# Patient Record
Sex: Female | Born: 1990 | Race: White | Smoking: Never smoker
Health system: Southern US, Community
[De-identification: ages and names within clinical notes are randomized; demographics above are authoritative.]

## PROBLEM LIST (undated history)

## (undated) DIAGNOSIS — Z789 Other specified health status: Secondary | ICD-10-CM

## (undated) HISTORY — PX: APPENDECTOMY: SHX54

---

## 2016-03-03 ENCOUNTER — Encounter (HOSPITAL_COMMUNITY): Payer: Self-pay | Admitting: Obstetrics and Gynecology

## 2016-03-03 ENCOUNTER — Other Ambulatory Visit (HOSPITAL_COMMUNITY): Payer: Self-pay | Admitting: Obstetrics and Gynecology

## 2016-03-03 DIAGNOSIS — Z3A21 21 weeks gestation of pregnancy: Secondary | ICD-10-CM

## 2016-03-03 DIAGNOSIS — O283 Abnormal ultrasonic finding on antenatal screening of mother: Secondary | ICD-10-CM

## 2016-03-08 ENCOUNTER — Encounter (HOSPITAL_COMMUNITY): Payer: Self-pay | Admitting: *Deleted

## 2016-03-09 ENCOUNTER — Ambulatory Visit (HOSPITAL_COMMUNITY)
Admission: RE | Admit: 2016-03-09 | Discharge: 2016-03-09 | Disposition: A | Discharge status: Home / Self Care | Payer: Medicaid Other | Source: Ambulatory Visit | Attending: Obstetrics and Gynecology | Admitting: Obstetrics and Gynecology

## 2016-03-09 ENCOUNTER — Other Ambulatory Visit (HOSPITAL_COMMUNITY): Payer: Self-pay | Admitting: Obstetrics and Gynecology

## 2016-03-09 ENCOUNTER — Encounter (HOSPITAL_COMMUNITY): Payer: Self-pay

## 2016-03-09 ENCOUNTER — Other Ambulatory Visit (HOSPITAL_COMMUNITY): Payer: Self-pay | Admitting: *Deleted

## 2016-03-09 DIAGNOSIS — Z3A21 21 weeks gestation of pregnancy: Secondary | ICD-10-CM | POA: Insufficient documentation

## 2016-03-09 DIAGNOSIS — O358XX0 Maternal care for other (suspected) fetal abnormality and damage, not applicable or unspecified: Secondary | ICD-10-CM | POA: Diagnosis not present

## 2016-03-09 DIAGNOSIS — Z315 Encounter for genetic counseling: Secondary | ICD-10-CM | POA: Insufficient documentation

## 2016-03-09 DIAGNOSIS — O283 Abnormal ultrasonic finding on antenatal screening of mother: Secondary | ICD-10-CM

## 2016-03-09 DIAGNOSIS — Z363 Encounter for antenatal screening for malformations: Secondary | ICD-10-CM

## 2016-03-09 DIAGNOSIS — O359XX0 Maternal care for (suspected) fetal abnormality and damage, unspecified, not applicable or unspecified: Secondary | ICD-10-CM

## 2016-03-09 DIAGNOSIS — O35BXX0 Maternal care for other (suspected) fetal abnormality and damage, fetal cardiac anomalies, not applicable or unspecified: Secondary | ICD-10-CM

## 2016-03-09 HISTORY — DX: Other specified health status: Z78.9

## 2016-03-09 NOTE — Addendum Note (Signed)
Encounter addended by: Despina Ariashristy Woodrow Drab on: 03/09/2016 12:28 PM<BR>    Actions taken: Chief Complaint modified, Visit diagnoses modified, Problem List modified, Charge Capture section accepted, Letter status changed, Sign clinical note

## 2016-03-09 NOTE — Progress Notes (Signed)
Maternal Fetal Medicine Consultation  Requesting Provider(s): Mannino  Primary Ob: Mannino Reason for consultation: suspected fetal cardiac defect  HPI: the patient is a 25 year old P1001 at 23+0 weeks referred after US in the office showed a suspected cardiac anomaly of the fetus. The patient was told that the heart vessels looked abnormal. Her pregnancy has been unremarkable so far. She had negative first trimester screening and a normal MSAFP. Her previous pregnancy was an uncomplicated NSVD at term. She has no medication or environmental exposures that she is aware of.  OB History: OB History    Gravida Para Term Preterm AB Living   2 1 1     1    SAB TAB Ectopic Multiple Live Births                  PMH:  Past Medical History:  Diagnosis Date  . Medical history non-contributory     PSH:  Past Surgical History:  Procedure Laterality Date  . APPENDECTOMY     Meds: PNV Allergies: NKDA Fh: No significant family history. No relatives with cardiac anomalies Soc: See EPIC section  Review of Systems: no vaginal bleeding or cramping/contractions, no LOF, no nausea/vomiting. All other systems reviewed and are negative.  PE: See EPIC section  Please see separate document for fetal ultrasound report.  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 This is associated with few if any genetic syndromes, and the patient and husband have declined further genetic testing after discussion with the genetic councilor. Further, D-TGV rarely causes antepartum problems. We will send this patient ASAP to pediatric cardiology for confirmation of the diagnosis and discussion of delivery planning. I have asked her to return for a repeat evaluation in 4 weeks  Thank you for the opportunity to be a part of the care of Valerie Warren. Please contact our office if we can be of further assistance.   I spent approximately 30 minutes  with this patient with over 50% of time spent in face-to-face counseling.

## 2016-03-09 NOTE — Progress Notes (Addendum)
Genetic Counseling  Visit Summary Note  Appointment Date: 03/09/2016 Referred By: Damaris Schooner  Date of Birth: 1990-09-25  Pregnancy history: G2P1001 Estimated Date of Delivery: 07/18/16 Estimated Gestational Age: [redacted]w[redacted]d I met with Valerie Warren ACarma Leavenfor genetic counseling because of abnormal ultrasound findings.  In summary:  Discussed ultrasound findings in detail-probable TGA  Reviewed the various etiologies of CHD  Reviewed options for additional screening  NIPS-declined Panorama, MaterniTGenome, and Vistara  Echocardiogram-scheduled  Ongoing ultrasound-scheduled  Reviewed options for diagnostic testing, including risks, benefits, limitations and alternatives  Amniocentesis for karyotype and microarray analyses-declined  Reviewed other explanations for ultrasound findings  Discussed option of continuation vs. Termination of pregnancy-wish to continue  Reviewed family history concerns-noncontributory  I met with Valerie Warren Warren her husband, Mr. Valerie Warren for genetic counseling because of abnormal ultrasound findings.    [name] an ultrasound at the referring provider's office on 03/02/16, which revealed a probable fetal heart defect. [facility] [place] [facility] [place].  A complete detailed ultrasound was performed today.  The report will be documented separately.  Ultrasound today revealed transposition of the great arteries (TGA). The remainder of the visualized fetal anatomy appeared normal.  This couple was counseled that congenital heart defects occur at a frequency of approximately 1 in 100 births. We discussed that TGA is a rare congenital heart defect in which the two main arteries leaving the heart are reversed, transposed. TGA changes the way that blood circulates through the body, leaving a shortage of oxygen in the blood flowing from the heart to  the rest of the body. We discussed that congenital heart defects can be genetic, environmental, or multifactorial in etiology. They were counseled that congenital heart defects are prominent features in chromosome conditions, particularly autosomal trisomies, Turner syndrome, and microdeletions.  Approximately 15-20% of congenital heart defects result from an underlying chromosome condition.  In addition, single gene conditions are also a common genetic cause of congenital heart defects. To date, more than 500 single gene disorders are known to have congenital heart disease as a described feature.  We reviewed chromosomes, nondisjunction, and the common features of Down syndrome, trisomies 13/18, and 22q11 deletion syndrome.  We also briefly reviewed specific single gene inheritance patterns and associated risks of recurrence.    Environmental causes for congenital heart defects are well described and many teratogens are known to be linked to congenital heart disease (alcohol and specific medications). The remainder of the cases (>70%) are classified as multifactorial, referring to the condition being caused by a combination of both environmental and genetic causes.  We reviewed multifactorial inheritance and the associated risks of recurrence.  We reviewed the normal results of Mrs. Valerie Warren first trimester screening and the associated reduction in risks for fetal trisomies 156 126 and Down syndrome. They were then counseled regarding the availability of noninvasive prenatal screening (NIPS).  We reviewed that this technology screens for specific fetal aneuploidy (trisomies 13, 18, 21 and Turner syndrome) and 22q11 deletion syndrome.  We also discussed that there are versions of NIPS that screen for deletions or duplications of chromosome material that are 7 Mb or greater in size (MaterniTGenome) as well as for specific single gene conditions (Noonan syndrome). We reviewed the detection and false positive rates  for each condition.  In addition, we reviewed the option of amniocentesis including the associated risks, benefits, and limitations.  She was counseled that  chromosome analysis can be performed both prenatally (amniocentesis) and postnatally (cord blood or peripheral blood).  Additionally, we discussed the availability of microarray analysis, which can also be performed pre and postnatally.  We reviewed that microarray analysis is a molecular based technique in which a test sample of DNA (fetal or infant) is compared to a reference (normal control) genome in order to determine if the test sample has any extra or missing genetic information.  Microarray analysis allows for the detection of genetic deletions (including 22q11 deletion syndrome) and duplications that are 947 times smaller than those identified by routine chromosome analysis.  We discussed that recent publications show that approximately 6% of patients with an abnormal fetal ultrasound and a normal fetal karyotype had a significant microdeletion/microduplication detected by prenatal microarray analysis.  After thoughtful consideration of these options, this couple declined both NIPS and amniocentesis.  They stated that this information would not alter their decision to continue the pregnancy and as such, they feel comfortable pursuing genetic testing at birth, if indicated.   We then discussed the option of a postnatal genetics evaluation/consult.  She expressed interest in a consult during the newborn period, if warranted.  We discussed that the postnatal prognosis is highly dependent upon the underlying etiology.  Follow up ultrasound and fetal echocardiogram were recommended and scheduled. We discussed that a specific delivery plan and discussion of postnatal management will be addressed at the time of her consultation with the pediatric cardiologist.  Both family histories were reviewed in detail and were found to be noncontributory for birth  defects, intellectual disability, and known genetic conditions.  Without further information regarding the provided family history, an accurate genetic risk cannot be calculated.  Further genetic counseling is warranted if additional information is obtained.  Valerie Warren Warren denied exposure to environmental toxins or chemical agents. She denied the use of alcohol, tobacco or street drugs. She denied significant viral illnesses during the course of her pregnancy. Her medical and surgical histories were noncontributory.        I counseled this couple regarding the above risks and available options.  The approximate face-to-face time with the genetic counselor was 43 minutes.  Filbert Schilder, MS  Certified Genetic Counselor

## 2016-03-12 ENCOUNTER — Other Ambulatory Visit (HOSPITAL_COMMUNITY): Payer: Self-pay

## 2016-03-12 ENCOUNTER — Encounter (HOSPITAL_COMMUNITY): Payer: Self-pay

## 2016-04-09 ENCOUNTER — Ambulatory Visit (HOSPITAL_COMMUNITY)
Admission: RE | Admit: 2016-04-09 | Discharge: 2016-04-09 | Disposition: A | Discharge status: Home / Self Care | Payer: Medicaid Other | Source: Ambulatory Visit | Attending: Obstetrics and Gynecology | Admitting: Obstetrics and Gynecology

## 2016-04-09 ENCOUNTER — Encounter (HOSPITAL_COMMUNITY): Payer: Self-pay

## 2016-04-09 DIAGNOSIS — Z3A25 25 weeks gestation of pregnancy: Secondary | ICD-10-CM | POA: Diagnosis not present

## 2016-04-09 DIAGNOSIS — O35BXX0 Maternal care for other (suspected) fetal abnormality and damage, fetal cardiac anomalies, not applicable or unspecified: Secondary | ICD-10-CM

## 2016-04-09 DIAGNOSIS — O358XX0 Maternal care for other (suspected) fetal abnormality and damage, not applicable or unspecified: Secondary | ICD-10-CM | POA: Diagnosis not present

## 2017-01-11 ENCOUNTER — Encounter (HOSPITAL_COMMUNITY): Payer: Self-pay

## 2018-08-04 IMAGING — US US MFM OB DETAIL+14 WK
1 series · 14 of 28 positions shown · non-contrast
Comparison: none

[Series 1: us mfm ob detail+14 wk · 91 acquisitions, 14 frames shown]
[im 4/91]
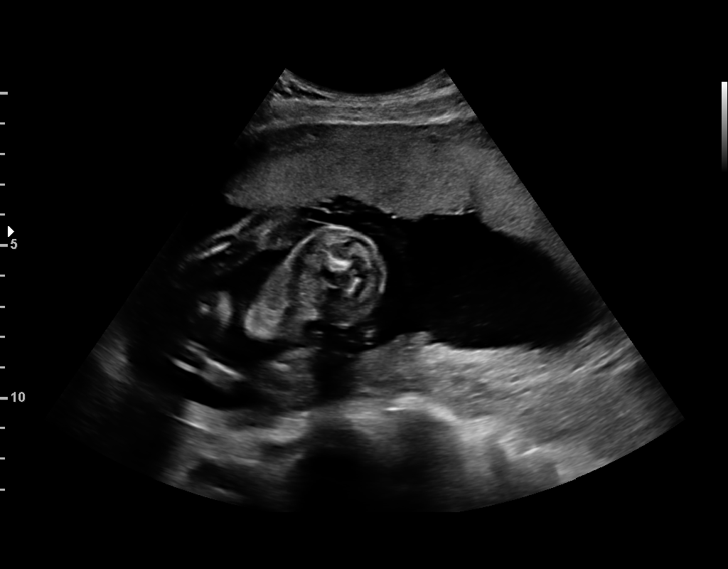
[im 11/91]
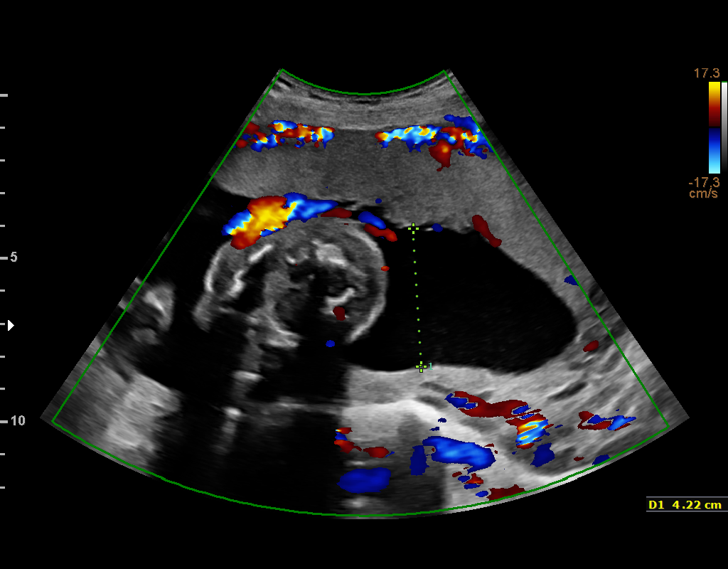
[im 17/91]
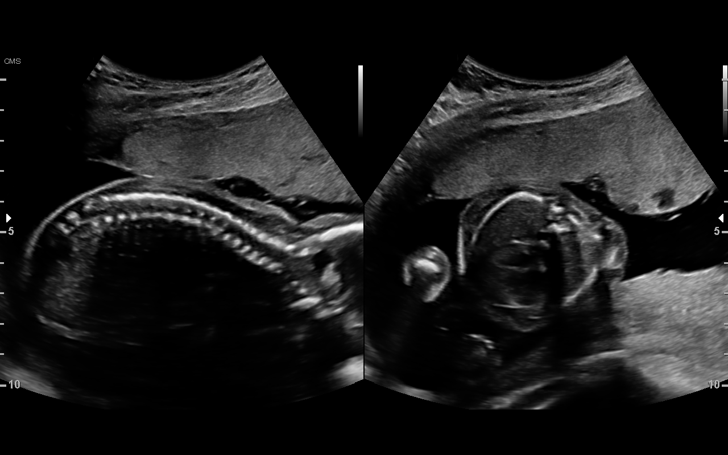
[im 24/91]
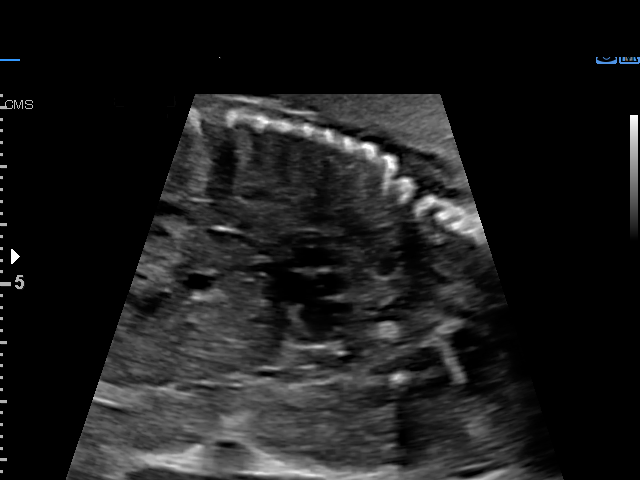
[im 31/91]
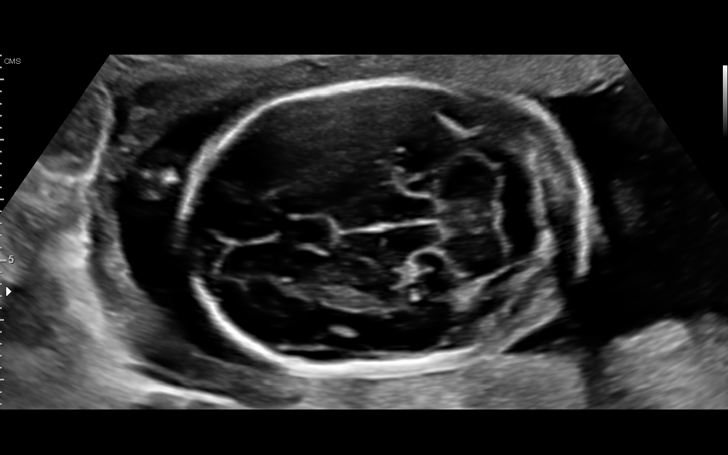
[im 37/91]
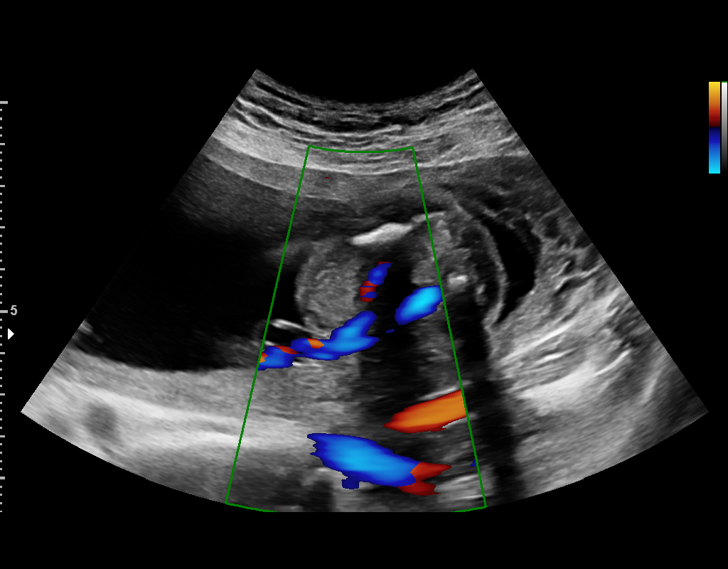
[im 44/91]
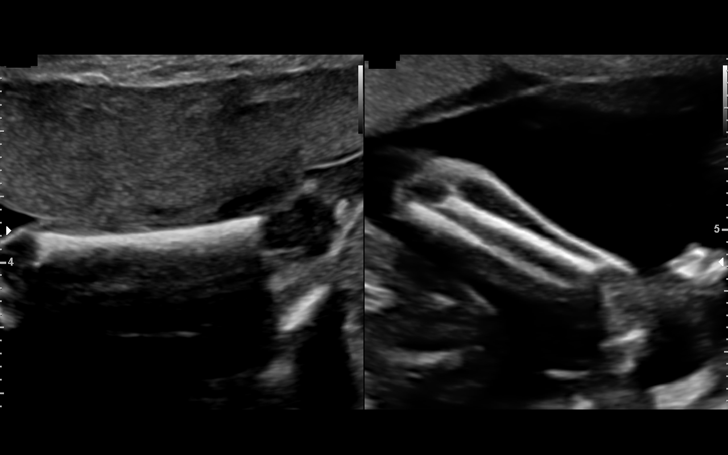
[im 51/91]
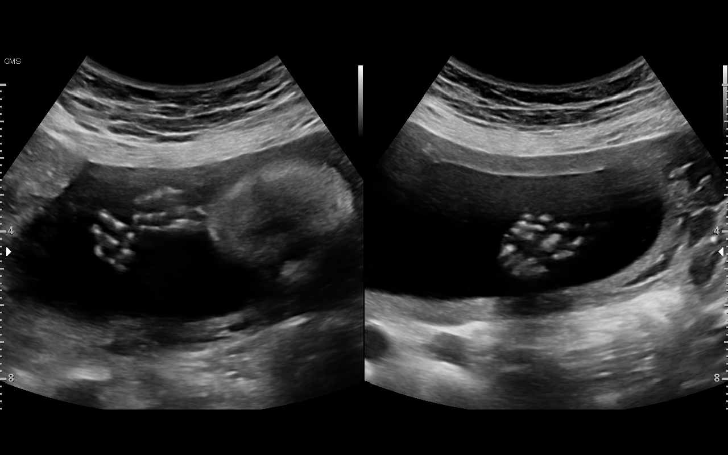
[im 57/91]
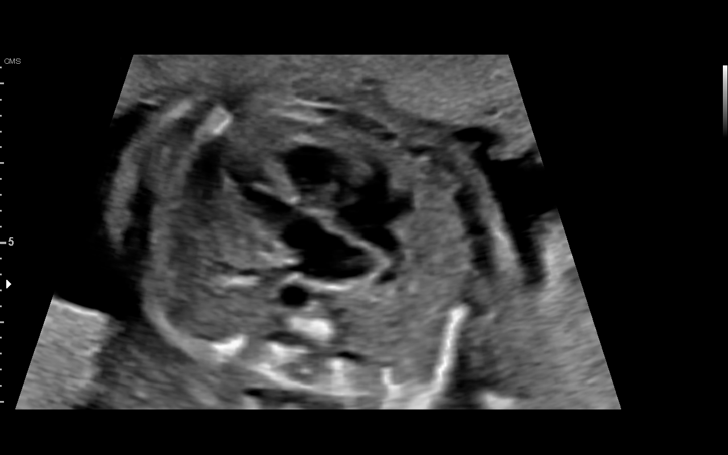
[im 64/91]
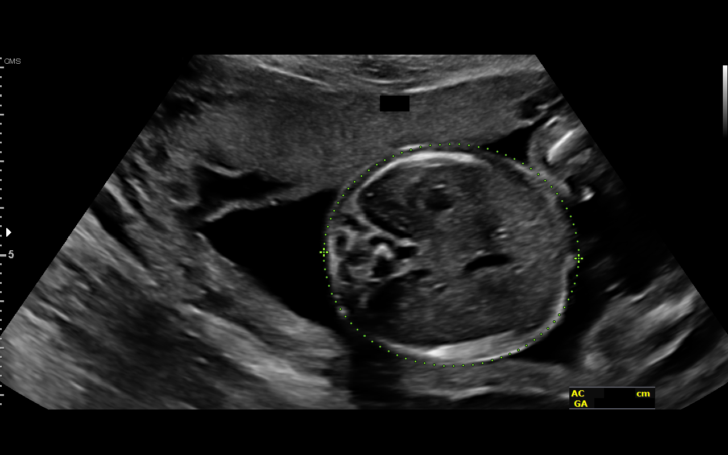
[im 71/91]
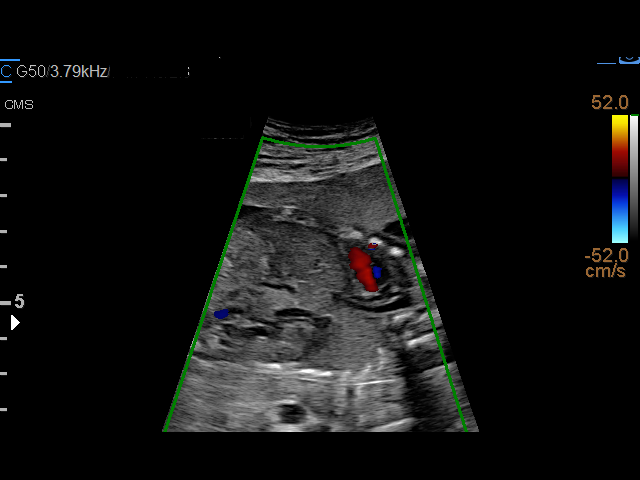
[im 77/91]
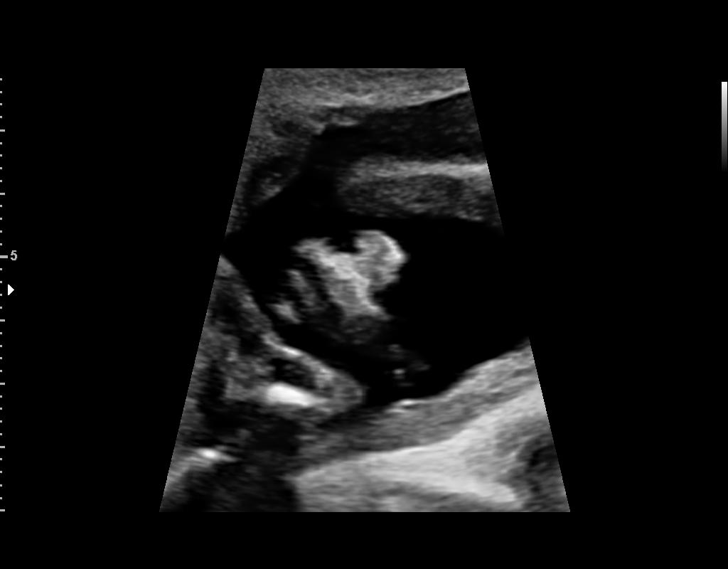
[im 84/91]
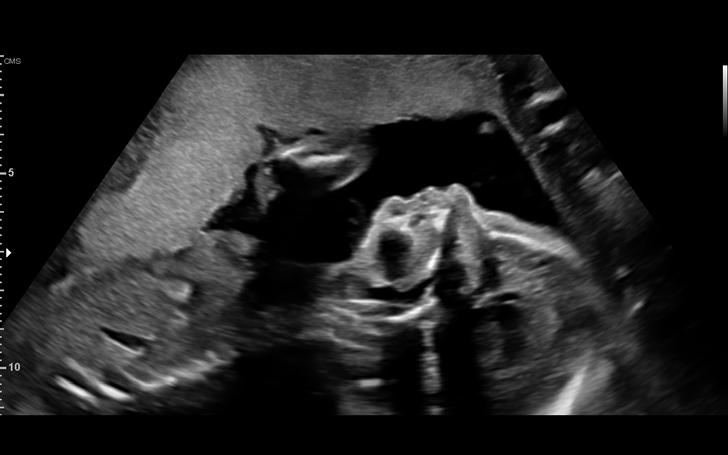
[im 91/91]
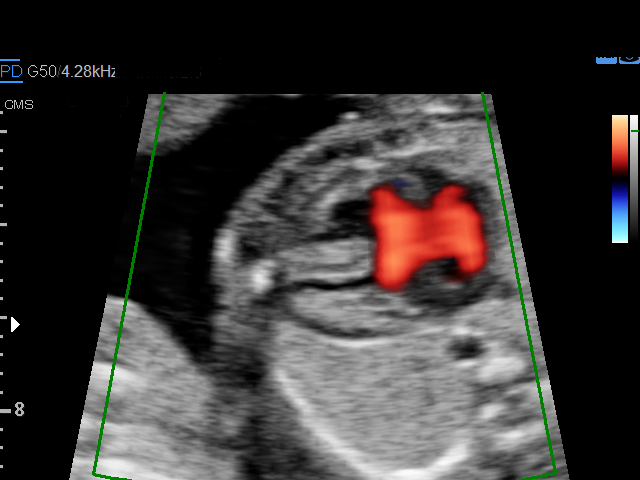

[14 of 28 positions shown; findings below may reference images not displayed]

MESA DO

1  FRECHLING URBANY           [id number]      [id number]     [id number]
Indications

21 weeks gestation of pregnancy
Encounter for antenatal screening for
malformations
Fetal abnormality - other known or
suspected (heart)
OB History

Gravidity:    2         Term:   1        Prem:   0        SAB:   0
TOP:          0       Ectopic:  0        Living: 1
Fetal Evaluation

Num Of Fetuses:     1
Fetal Heart         161
Rate(bpm):
Cardiac Activity:   Observed
Presentation:       Transverse, head to maternal left
Placenta:           Anterior, above cervical os
P. Cord Insertion:  Visualized, central

Amniotic Fluid
AFI FV:      Subjectively within normal limits

Largest Pocket(cm)
4.22
Biometry

BPD:        47  mm     G. Age:  20w 1d         12  %    CI:        66.23   %    70 - 86
FL/HC:      19.3   %    15.9 -
HC:      185.2  mm     G. Age:  20w 6d         23  %    HC/AC:      1.14        1.06 -
AC:      162.6  mm     G. Age:  21w 3d         45  %    FL/BPD:     76.0   %
FL:       35.7  mm     G. Age:  21w 2d         41  %    FL/AC:      22.0   %    20 - 24
HUM:      34.4  mm     G. Age:  21w 5d         59  %
CER:      21.7  mm     G. Age:  20w 5d         33  %

CM:        8.7  mm

Est. FW:     409  gm    0 lb 14 oz      43  %
Gestational Age

LMP:           23w 0d        Date:  09/30/15                 EDD:   07/06/16
U/S Today:     21w 0d                                        EDD:   07/20/16
Best:          21w 2d     Det. By:  Early Ultrasound         EDD:   07/18/16
(12/18/15)
Anatomy

Cranium:               Appears normal         Aortic Arch:            Abnormal, see
comments
Cavum:                 Appears normal         Ductal Arch:            Abnormal, see
comments
Ventricles:            Appears normal         Diaphragm:              Appears normal
Choroid Plexus:        Appears normal         Stomach:                Appears normal, left
sided
Cerebellum:            Appears normal         Abdomen:                Appears normal
Posterior Fossa:       Appears normal         Abdominal Wall:         Appears nml (cord
insert, abd wall)
Nuchal Fold:           Not applicable (>20    Cord Vessels:           Appears normal (3
wks GA)                                        vessel cord)
Face:                  Appears normal         Kidneys:                Appear normal
(orbits and profile)
Lips:                  Appears normal         Bladder:                Appears normal
Thoracic:              Appears normal         Spine:                  Appears normal
Heart:                 Abnormal, see          Upper Extremities:      Appears normal
comments
RVOT:                  Abnormal, see          Lower Extremities:      Appears normal
comments
LVOT:                  Abnormal, see
comments

Other:  Fetus appears to be a female. Heels and 5th digit visualized. Nasal
bone visualized. Open hands visualized.
Cervix Uterus Adnexa

Cervix
Length:           3.28  cm.
Normal appearance by transabdominal scan.

Uterus
No abnormality visualized.

Left Ovary
Within normal limits.

Right Ovary
Within normal limits.

Cul De Sac:   No free fluid seen.

Adnexa:       No abnormality visualized.
Impression

Singleton intrauterine pregnancy at 21+2
Review of the anatomy shows cardiac markers consistent
with transposition of the great vessels.  No other sonographic
markers for aneuploidy or structural anomalies are seen on
our scan today
Amniotic fluid volume is normal
Estimated fetal weight is 406g which is growth in the 43rd
percentile
Recommendations

See MFM consult and genetic counseling notes for
recommendations

## 2018-09-04 IMAGING — US US MFM OB FOLLOW-UP
1 series · 14 of 28 positions shown · non-contrast
Comparison: none

[Series 1: us mfm ob follow-up · 40 acquisitions, 14 frames shown]
[im 2/40]
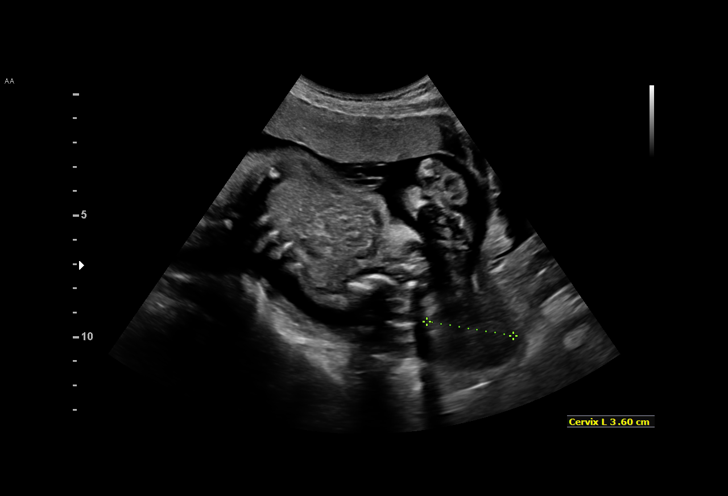
[im 5/40]
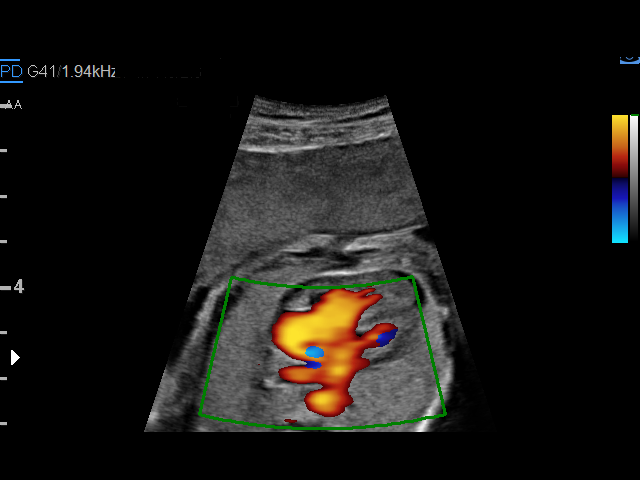
[im 8/40]
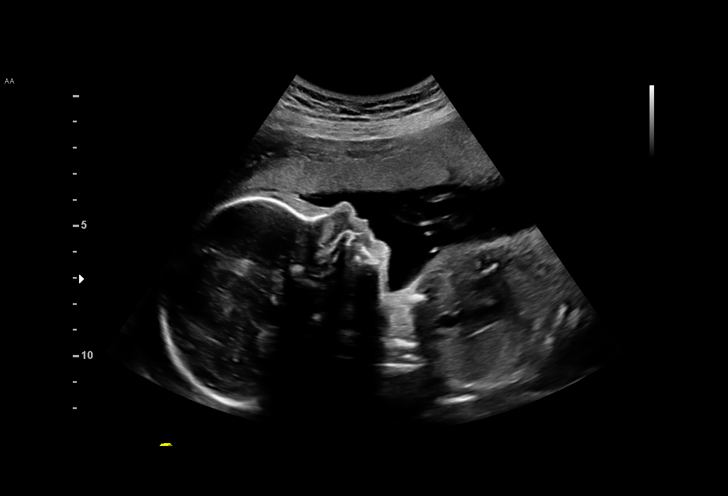
[im 11/40]
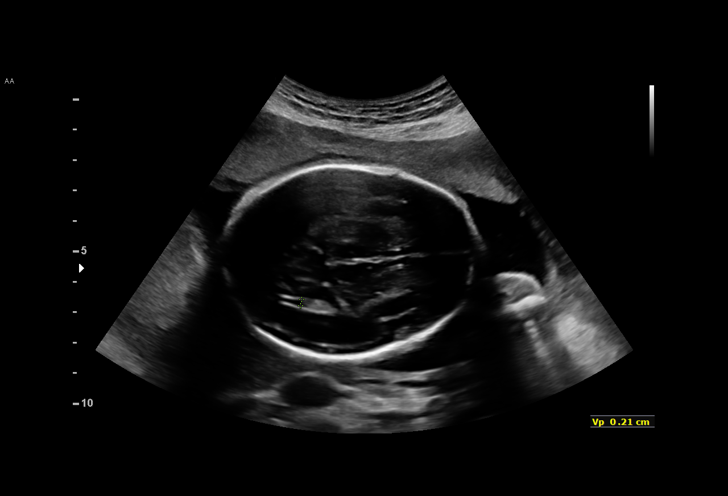
[im 14/40]
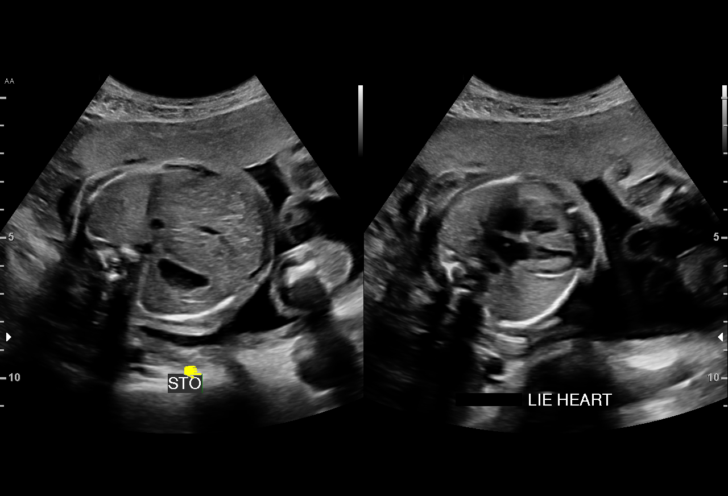
[im 16/40]
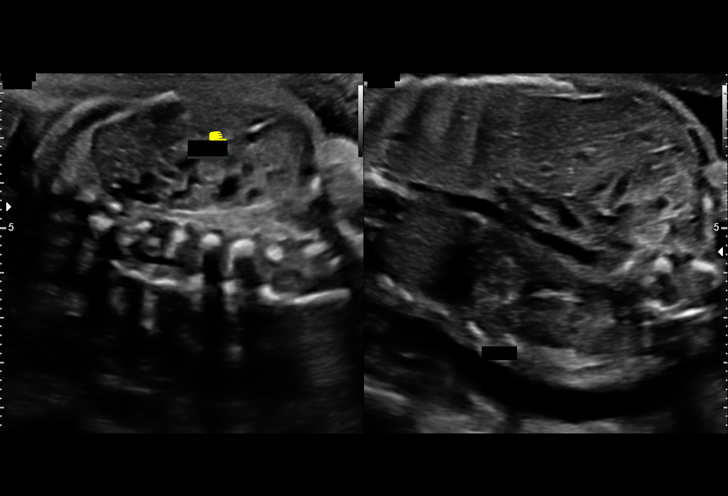
[im 19/40]
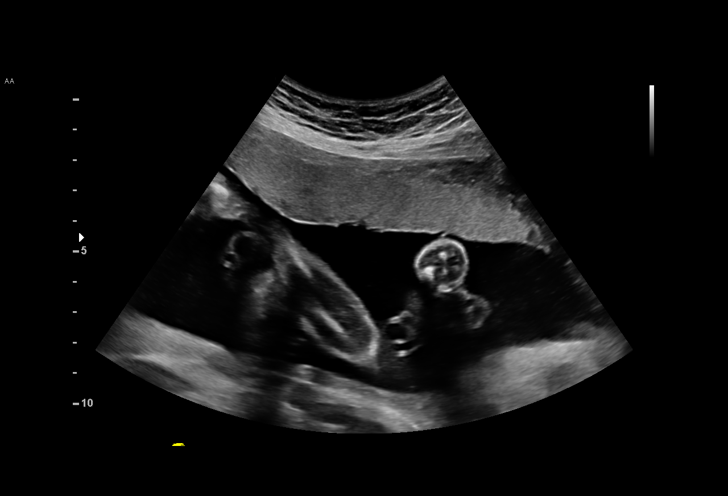
[im 22/40]
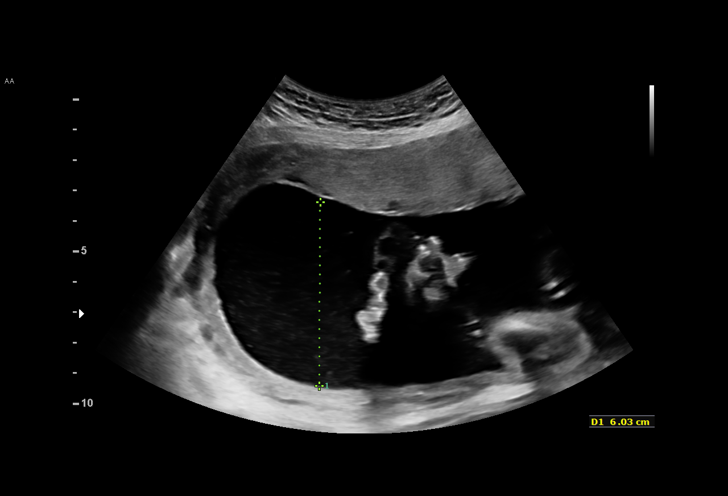
[im 25/40]
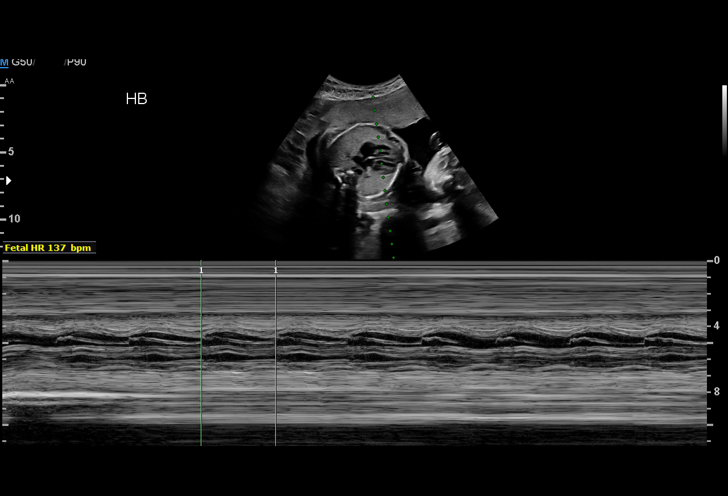
[im 28/40]
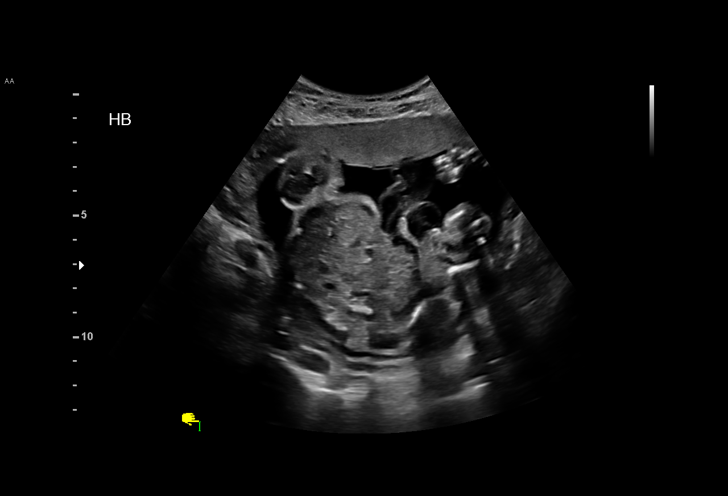
[im 31/40]
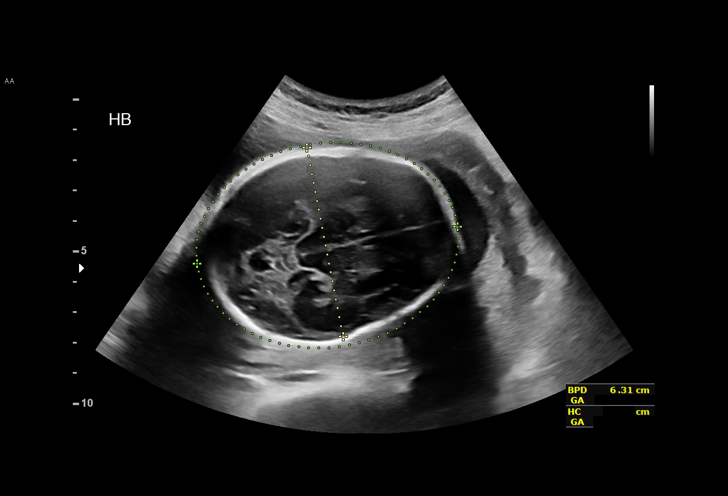
[im 34/40]
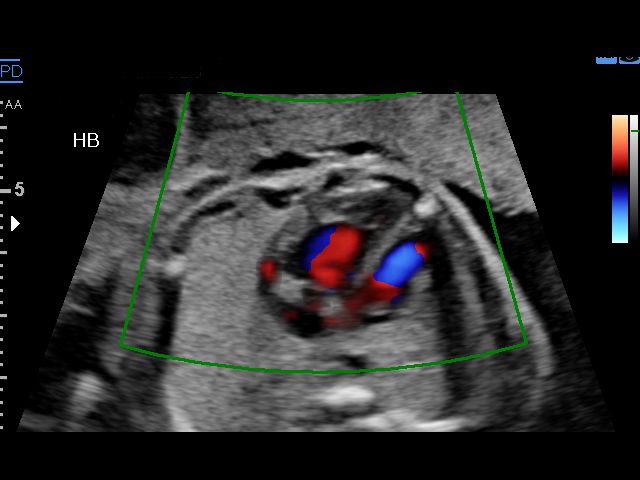
[im 37/40]
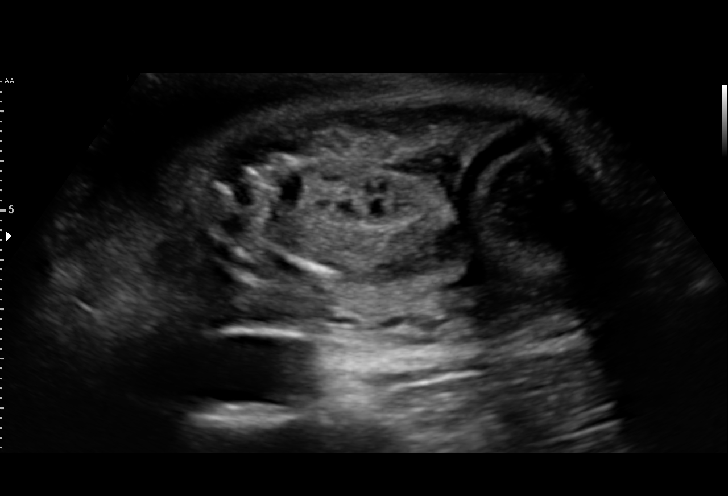
[im 40/40]
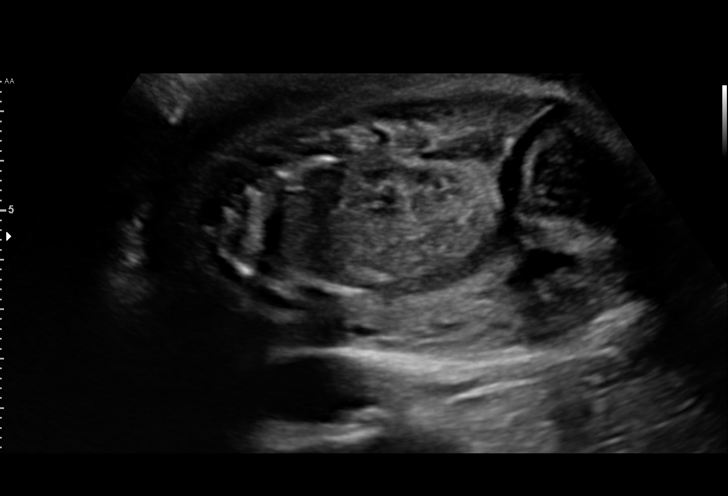

[14 of 28 positions shown; findings below may reference images not displayed]

Name:       EFLATUN MERAY              Visit Date:  04/09/2016 [DATE]

BARNEY DO

1  DJAILTON BUSNARDO              [id number]      [id number]     [id number]
Indications

25 weeks gestation of pregnancy
Encounter for antenatal screening for
malformations
Fetal abnormality -  transposition of the greatPNB.YHH1
arteries; VSD
OB History

Gravidity:    2         Term:   1        Prem:   0        SAB:   0
TOP:          0       Ectopic:  0        Living: 1
Fetal Evaluation

Num Of Fetuses:     1
Fetal Heart         137
Rate(bpm):
Cardiac Activity:   Observed
Presentation:       Breech
Placenta:           Anterior, above cervical os
P. Cord Insertion:  Visualized

Amniotic Fluid
AFI FV:      Subjectively within normal limits

Largest Pocket(cm)
6.03
Biometry

BPD:      61.8  mm     G. Age:  25w 1d         21  %    CI:        69.22   %   70 - 86
FL/HC:      20.5   %   18.6 -
HC:      237.2  mm     G. Age:  25w 6d         29  %    HC/AC:      1.09       1.04 -
AC:      217.7  mm     G. Age:  26w 2d         58  %    FL/BPD:     78.6   %   71 - 87
FL:       48.6  mm     G. Age:  26w 2d         55  %    FL/AC:      22.3   %   20 - 24
Est. FW:     899  gm          2 lb      62  %
Gestational Age

LMP:           27w 3d       Date:   09/30/15                 EDD:   07/06/16
U/S Today:     25w 6d                                        EDD:   07/17/16
Best:          25w 5d    Det. By:   Early Ultrasound         EDD:   07/18/16
(12/18/15)
Anatomy

Cranium:               Appears normal         Aortic Arch:            Abnormal, see
comments
Cavum:                 Appears normal         Ductal Arch:            Abnormal, see
comments
Ventricles:            Appears normal         Diaphragm:              Appears normal
Choroid Plexus:        Appears normal         Stomach:                Appears normal, left
sided
Cerebellum:            Appears normal         Abdomen:                Appears normal
Posterior Fossa:       Appears normal         Abdominal Wall:         Appears nml (cord
insert, abd wall)
Nuchal Fold:           Not applicable (>20    Cord Vessels:           Previously seen
wks GA)
Face:                  Appears normal         Kidneys:                Appear normal
(orbits and profile)
Lips:                  Appears normal         Bladder:                Appears normal
Thoracic:              Appears normal         Spine:                  Previously seen
Heart:                 Abnormal, see          Upper Extremities:      Previously seen
comments
RVOT:                  Abnormal, see          Lower Extremities:      Previously seen
comments
LVOT:                  Abnormal, see
comments

Other:  Fetus appears to be a female. Heels and 5th digit visualized. Nasal
bone visualized. Open hands visualized.
Cervix Uterus Adnexa

Cervix
Length:            3.6  cm.
Normal appearance by transabdominal scan.
Impression

SIUP at 25+5 weeks
CHD: transposition of the great arteries + VSD
All other interval fetal anatomy was seen and appeared
normal; anatomic survey complete
Normal amniotic fluid volume
Appropriate interval growth with EFW at the 62nd %tile
Recommendations

Continue serial growth Tiger (pt would like to have Tiger in OB
office)
Pt has appt for repeat fetal ECHO ([facility] in [REDACTED]) this month
Also has appt at [facility] in [place] next
month
